# Patient Record
Sex: Female | Born: 1965 | Race: Black or African American | Hispanic: No | Marital: Married | Smoking: Never smoker
Health system: Southern US, Community
[De-identification: ages and names within clinical notes are randomized; demographics above are authoritative.]

## PROBLEM LIST (undated history)

## (undated) DIAGNOSIS — Z789 Other specified health status: Secondary | ICD-10-CM

## (undated) DIAGNOSIS — D259 Leiomyoma of uterus, unspecified: Secondary | ICD-10-CM

## (undated) DIAGNOSIS — D649 Anemia, unspecified: Secondary | ICD-10-CM

## (undated) HISTORY — PX: DIAGNOSTIC LAPAROSCOPY: SUR761

## (undated) HISTORY — PX: DILATION AND CURETTAGE OF UTERUS: SHX78

---

## 1999-07-28 ENCOUNTER — Other Ambulatory Visit: Admission: RE | Admit: 1999-07-28 | Discharge: 1999-07-28 | Payer: Self-pay | Admitting: Gynecology

## 2001-02-16 ENCOUNTER — Other Ambulatory Visit: Admission: RE | Admit: 2001-02-16 | Discharge: 2001-02-16 | Payer: Self-pay | Admitting: Obstetrics and Gynecology

## 2002-07-31 ENCOUNTER — Encounter: Payer: Self-pay | Admitting: Obstetrics and Gynecology

## 2002-07-31 ENCOUNTER — Ambulatory Visit (HOSPITAL_COMMUNITY): Admission: RE | Admit: 2002-07-31 | Discharge: 2002-07-31 | Payer: Self-pay | Admitting: Obstetrics and Gynecology

## 2003-02-17 ENCOUNTER — Encounter: Payer: Self-pay | Admitting: Obstetrics and Gynecology

## 2003-02-17 ENCOUNTER — Inpatient Hospital Stay (HOSPITAL_COMMUNITY): Admission: AD | Admit: 2003-02-17 | Discharge: 2003-02-17 | Payer: Self-pay | Admitting: Obstetrics and Gynecology

## 2003-02-19 ENCOUNTER — Inpatient Hospital Stay (HOSPITAL_COMMUNITY): Admission: AD | Admit: 2003-02-19 | Discharge: 2003-02-19 | Payer: Self-pay | Admitting: Obstetrics and Gynecology

## 2005-10-06 ENCOUNTER — Other Ambulatory Visit: Admission: RE | Admit: 2005-10-06 | Discharge: 2005-10-06 | Payer: Self-pay | Admitting: Obstetrics and Gynecology

## 2006-12-19 ENCOUNTER — Encounter: Admission: RE | Admit: 2006-12-19 | Discharge: 2006-12-19 | Payer: Self-pay | Admitting: Obstetrics and Gynecology

## 2010-12-30 ENCOUNTER — Other Ambulatory Visit: Payer: Self-pay | Admitting: Family Medicine

## 2010-12-30 DIAGNOSIS — N6320 Unspecified lump in the left breast, unspecified quadrant: Secondary | ICD-10-CM

## 2010-12-31 ENCOUNTER — Other Ambulatory Visit: Payer: Self-pay | Admitting: Family Medicine

## 2010-12-31 ENCOUNTER — Ambulatory Visit
Admission: RE | Admit: 2010-12-31 | Discharge: 2010-12-31 | Disposition: A | Discharge status: Home / Self Care | Source: Ambulatory Visit | Attending: Family Medicine | Admitting: Family Medicine

## 2010-12-31 DIAGNOSIS — N6320 Unspecified lump in the left breast, unspecified quadrant: Secondary | ICD-10-CM

## 2011-07-12 ENCOUNTER — Other Ambulatory Visit: Payer: Self-pay | Admitting: Obstetrics and Gynecology

## 2012-09-29 ENCOUNTER — Encounter (HOSPITAL_COMMUNITY): Payer: Self-pay | Admitting: Pharmacist

## 2012-10-04 ENCOUNTER — Other Ambulatory Visit: Payer: Self-pay | Admitting: Obstetrics and Gynecology

## 2012-10-04 DIAGNOSIS — R1032 Left lower quadrant pain: Secondary | ICD-10-CM

## 2012-10-05 ENCOUNTER — Other Ambulatory Visit

## 2012-10-08 ENCOUNTER — Ambulatory Visit
Admission: RE | Admit: 2012-10-08 | Discharge: 2012-10-08 | Disposition: A | Discharge status: Home / Self Care | Source: Ambulatory Visit | Attending: Obstetrics and Gynecology | Admitting: Obstetrics and Gynecology

## 2012-10-08 DIAGNOSIS — R1032 Left lower quadrant pain: Secondary | ICD-10-CM

## 2012-10-08 MED ORDER — IOHEXOL 300 MG/ML  SOLN
100.0000 mL | Freq: Once | INTRAMUSCULAR | Status: AC | PRN
  Administered 2012-10-08: 100 mL via INTRAVENOUS

## 2012-10-09 ENCOUNTER — Encounter (HOSPITAL_COMMUNITY): Admission: RE | Payer: Self-pay | Source: Ambulatory Visit

## 2012-10-09 ENCOUNTER — Ambulatory Visit (HOSPITAL_COMMUNITY): Admission: RE | Admit: 2012-10-09 | Source: Ambulatory Visit | Admitting: Obstetrics and Gynecology

## 2012-10-09 SURGERY — HYSTERECTOMY, SUPRACERVICAL, ABDOMINAL
Anesthesia: General

## 2012-12-03 ENCOUNTER — Encounter (HOSPITAL_COMMUNITY): Payer: Self-pay | Admitting: Pharmacist

## 2012-12-05 ENCOUNTER — Other Ambulatory Visit: Payer: Self-pay | Admitting: Obstetrics and Gynecology

## 2012-12-12 ENCOUNTER — Encounter (HOSPITAL_COMMUNITY)
Admission: RE | Admit: 2012-12-12 | Discharge: 2012-12-12 | Disposition: A | Discharge status: Home / Self Care | Source: Ambulatory Visit | Attending: Obstetrics and Gynecology | Admitting: Obstetrics and Gynecology

## 2012-12-12 ENCOUNTER — Encounter (HOSPITAL_COMMUNITY): Payer: Self-pay

## 2012-12-12 HISTORY — DX: Other specified health status: Z78.9

## 2012-12-12 LAB — COMPREHENSIVE METABOLIC PANEL
ALT: 15 U/L (ref 0–35)
AST: 22 U/L (ref 0–37)
Alkaline Phosphatase: 37 U/L — ABNORMAL LOW (ref 39–117)
CO2: 26 mEq/L (ref 19–32)
Chloride: 101 mEq/L (ref 96–112)
GFR calc non Af Amer: 79 mL/min — ABNORMAL LOW (ref >90)
Glucose, Bld: 113 mg/dL — ABNORMAL HIGH (ref 70–99)
Sodium: 137 mEq/L (ref 135–145)
Total Bilirubin: 0.3 mg/dL (ref 0.3–1.2)

## 2012-12-12 LAB — CBC
Hemoglobin: 11.3 g/dL — ABNORMAL LOW (ref 12.0–15.0)
MCV: 92.5 fL (ref 78.0–100.0)
Platelets: 244 10*3/uL (ref 150–400)
RBC: 3.72 MIL/uL — ABNORMAL LOW (ref 3.87–5.11)
WBC: 5.1 10*3/uL (ref 4.0–10.5)

## 2012-12-12 NOTE — Patient Instructions (Addendum)
20 LIL LEPAGE  12/12/2012   Your procedure is scheduled on:  12/18/12  Enter through the Main Entrance of [facility] at 730 AM.  Pick up the phone at the desk and dial [phone].   Call this number if you have problems the morning of surgery: [phone]   Remember:   Do not eat food:After Midnight.  Do not drink clear liquids: After Midnight.  Take these medicines the morning of surgery with A SIP OF WATER: NA   Do not wear jewelry, make-up or nail polish.  Do not wear lotions, powders, or perfumes. You may wear deodorant.  Do not shave 48 hours prior to surgery.  Do not bring valuables to the hospital.  Contacts, dentures or bridgework may not be worn into surgery.  Leave suitcase in the car. After surgery it may be brought to your room.  For patients admitted to the hospital, checkout time is 11:00 AM the day of discharge.   Patients discharged the day of surgery will not be allowed to drive home.  Name and phone number of your driver: NA  Special Instructions: Shower using CHG 2 nights before surgery and the night before surgery.  If you shower the day of surgery use CHG.  Use special wash - you have one bottle of CHG for all showers.  You should use approximately 1/3 of the bottle for each shower.   Please read over the following fact sheets that you were given: MRSA Information and Surgical Site Infection Prevention

## 2012-12-17 NOTE — H&P (Signed)
Rebecca Wiley is an 47 y.o. female with a history of very heavy periods and large fibroids. Attempts were made to control her heavy cycles using a Mirena IUD but this proved unsuccessful. Previously she was treated in 2012 with depot Lupron but this was stopped after 3 courses. She is now being admitted to undergo a supracervical hysterectomy to deal with these large fibroids. The patient has requested that her cervix be preserved if possible at the time of surgery. This menorrhagia from these fibroids has led to significant anemia which has been treated with hematinic agents.  Pertinent Gynecological History: Menses: flow is excessive with use of 8 pads or tampons on heaviest days Contraception: IUD DES exposure: denies Blood transfusions: none Sexually transmitted diseases: no past history Last mammogram: normal Date: 08/20/2012 Last pap: normal Date: 07/12/2011 OB History: G2 P0020  Menstrual History: Menarche age:53 No LMP recorded. Patient is not currently having periods (Reason: IUD).    Past Medical History  Diagnosis Date  . Medical history non-contributory     Past Surgical History  Procedure Laterality Date  . Diagnostic laparoscopy    . Dilation and curettage of uterus      No family history on file.  Social History:  reports that she has never smoked. She does not have any smokeless tobacco history on file. She reports that  drinks alcohol. She reports that she does not use illicit drugs.  Allergies:  Allergies  Allergen Reactions  . Latex Other (See Comments)    Bacterial infection  . Sulfa Antibiotics Swelling and Rash    Eye swelling    No prescriptions prior to admission    Review of Systems  Constitutional: Negative.  Negative for fever, chills and weight loss.  HENT: Negative.  Negative for hearing loss and tinnitus.   Eyes: Negative.   Respiratory: Negative for cough, hemoptysis, sputum production and shortness of breath.   Cardiovascular: Negative  for chest pain, palpitations and orthopnea.  Gastrointestinal: Negative for heartburn, nausea, vomiting, abdominal pain, diarrhea and constipation.  Genitourinary: Negative for dysuria, urgency and frequency.  Musculoskeletal: Negative.   Skin: Negative.   Neurological: Negative for dizziness, tingling, tremors and headaches.  Endo/Heme/Allergies: Negative.   Psychiatric/Behavioral: Negative.     There were no vitals taken for this visit. Physical Exam  Constitutional: She is oriented to person, place, and time. She appears well-developed and well-nourished.  HENT:  Head: Normocephalic and atraumatic.  Mouth/Throat: Oropharynx is clear and moist.  Eyes: Conjunctivae and EOM are normal. Pupils are equal, round, and reactive to light.  Neck: Normal range of motion. Neck supple. JVD present. No tracheal deviation present. No thyromegaly present.  Cardiovascular: Normal rate, regular rhythm and normal heart sounds.  Exam reveals no gallop.   No murmur heard. Respiratory: Effort normal and breath sounds normal.  GI: Soft. She exhibits mass. She exhibits no distension. There is no tenderness. There is no rebound.  Musculoskeletal: Normal range of motion.  Neurological: She is alert and oriented to person, place, and time. She has normal reflexes.  Skin: Skin is warm and dry.  Psychiatric: She has a normal mood and affect. Her behavior is normal. Judgment and thought content normal.  There is a suprapubic mass present almost to the umbilicus consistent with a fibroid uterus. Pelvic Exam:   External genitalia are within normal liits   BUS: are normal   Vagina is without lesion and has normal discharge   Cervix is non tender and without gross lesions  Uterus is markedly enlarged 16 week equivalent size with fibroids present   Adnexa can not be assessed do to the fibroids   No results found for this or any previous visit (from the past 24 hour(s)).  No results found.  Impression:  Menorrhagia secondary to large uterine fibroids.  Plan: To proceed with supracervical hysterectomy. The risks of infection, hemorrhage, blood clot formation and injury to adjacent structures were discussed with the patient and her partner.  Shiann Kam 12/17/2012, 7:07 PM

## 2012-12-18 ENCOUNTER — Inpatient Hospital Stay (HOSPITAL_COMMUNITY): Admitting: Anesthesiology

## 2012-12-18 ENCOUNTER — Encounter (HOSPITAL_COMMUNITY): Payer: Self-pay | Admitting: General Practice

## 2012-12-18 ENCOUNTER — Inpatient Hospital Stay (HOSPITAL_COMMUNITY)
Admission: RE | Admit: 2012-12-18 | Discharge: 2012-12-20 | DRG: 743 | Disposition: A | Discharge status: Home / Self Care | Source: Ambulatory Visit | Attending: Obstetrics and Gynecology | Admitting: Obstetrics and Gynecology

## 2012-12-18 ENCOUNTER — Encounter (HOSPITAL_COMMUNITY)
Admission: RE | Disposition: A | Discharge status: Home / Self Care | Payer: Self-pay | Source: Ambulatory Visit | Attending: Obstetrics and Gynecology

## 2012-12-18 ENCOUNTER — Encounter (HOSPITAL_COMMUNITY): Payer: Self-pay | Admitting: Anesthesiology

## 2012-12-18 DIAGNOSIS — D219 Benign neoplasm of connective and other soft tissue, unspecified: Secondary | ICD-10-CM

## 2012-12-18 DIAGNOSIS — D251 Intramural leiomyoma of uterus: Secondary | ICD-10-CM | POA: Diagnosis present

## 2012-12-18 DIAGNOSIS — N920 Excessive and frequent menstruation with regular cycle: Principal | ICD-10-CM | POA: Diagnosis present

## 2012-12-18 HISTORY — PX: ABDOMINAL HYSTERECTOMY: SHX81

## 2012-12-18 LAB — HEMOGLOBIN: Hemoglobin: 9.5 g/dL — ABNORMAL LOW (ref 12.0–15.0)

## 2012-12-18 SURGERY — HYSTERECTOMY, ABDOMINAL
Anesthesia: General | Site: Abdomen | Wound class: Clean Contaminated

## 2012-12-18 MED ORDER — KETOROLAC TROMETHAMINE 30 MG/ML IJ SOLN: 15.0000 mg | Freq: Once | INTRAMUSCULAR | Status: DC | PRN

## 2012-12-18 MED ORDER — OXYCODONE-ACETAMINOPHEN 5-325 MG PO TABS
1.0000 | ORAL_TABLET | ORAL | Status: DC | PRN
  Administered 2012-12-19: 1 via ORAL
  Administered 2012-12-19 (×2): 2 via ORAL
  Administered 2012-12-20: 1 via ORAL
  Filled 2012-12-18 (×2): qty 2
  Filled 2012-12-18 (×2): qty 1

## 2012-12-18 MED ORDER — DEXAMETHASONE SODIUM PHOSPHATE 10 MG/ML IJ SOLN
INTRAMUSCULAR | Status: DC | PRN
  Administered 2012-12-18: 10 mg via INTRAVENOUS

## 2012-12-18 MED ORDER — DIPHENHYDRAMINE HCL 12.5 MG/5ML PO ELIX: 12.5000 mg | ORAL_SOLUTION | Freq: Four times a day (QID) | ORAL | Status: DC | PRN

## 2012-12-18 MED ORDER — METOCLOPRAMIDE HCL 5 MG/ML IJ SOLN: 10.0000 mg | Freq: Once | INTRAMUSCULAR | Status: DC | PRN

## 2012-12-18 MED ORDER — MIDAZOLAM HCL 2 MG/2ML IJ SOLN
INTRAMUSCULAR | Status: AC
  Filled 2012-12-18: qty 2

## 2012-12-18 MED ORDER — SODIUM CHLORIDE 0.9 % IJ SOLN: 9.0000 mL | INTRAMUSCULAR | Status: DC | PRN

## 2012-12-18 MED ORDER — SENSORCAINE 0.25 % 270ML FOR PAIN PUMP OPTIME
INTRAMUSCULAR | Status: DC | PRN
  Administered 2012-12-18: 270 mL

## 2012-12-18 MED ORDER — FENTANYL CITRATE 0.05 MG/ML IJ SOLN
INTRAMUSCULAR | Status: AC
  Filled 2012-12-18: qty 5

## 2012-12-18 MED ORDER — DEXAMETHASONE SODIUM PHOSPHATE 10 MG/ML IJ SOLN
INTRAMUSCULAR | Status: AC
  Filled 2012-12-18: qty 1

## 2012-12-18 MED ORDER — LIDOCAINE HCL (CARDIAC) 20 MG/ML IV SOLN
INTRAVENOUS | Status: AC
  Filled 2012-12-18: qty 5

## 2012-12-18 MED ORDER — DIPHENHYDRAMINE HCL 50 MG/ML IJ SOLN
INTRAMUSCULAR | Status: AC
  Filled 2012-12-18: qty 1

## 2012-12-18 MED ORDER — CEFAZOLIN SODIUM-DEXTROSE 2-3 GM-% IV SOLR
INTRAVENOUS | Status: AC
  Filled 2012-12-18: qty 50

## 2012-12-18 MED ORDER — HYDROMORPHONE 0.3 MG/ML IV SOLN
INTRAVENOUS | Status: DC
  Administered 2012-12-18: 12:00:00 via INTRAVENOUS
  Administered 2012-12-18 (×2): 1.2 mg via INTRAVENOUS
  Administered 2012-12-18 – 2012-12-19 (×3): 0.9 mg via INTRAVENOUS
  Administered 2012-12-19: 0.6 mg via INTRAVENOUS
  Filled 2012-12-18: qty 25

## 2012-12-18 MED ORDER — ROCURONIUM BROMIDE 50 MG/5ML IV SOLN
INTRAVENOUS | Status: AC
  Filled 2012-12-18: qty 1

## 2012-12-18 MED ORDER — PROPOFOL 10 MG/ML IV EMUL
INTRAVENOUS | Status: DC | PRN
  Administered 2012-12-18: 180 mg via INTRAVENOUS

## 2012-12-18 MED ORDER — LIDOCAINE HCL 1 % IJ SOLN
INTRAMUSCULAR | Status: DC | PRN
  Administered 2012-12-18: 2 mL

## 2012-12-18 MED ORDER — ONDANSETRON HCL 4 MG/2ML IJ SOLN
INTRAMUSCULAR | Status: AC
  Filled 2012-12-18: qty 2

## 2012-12-18 MED ORDER — ONDANSETRON HCL 4 MG/2ML IJ SOLN: 4.0000 mg | Freq: Four times a day (QID) | INTRAMUSCULAR | Status: DC | PRN

## 2012-12-18 MED ORDER — HYDROMORPHONE HCL PF 1 MG/ML IJ SOLN
INTRAMUSCULAR | Status: AC
  Administered 2012-12-18: 0.5 mg via INTRAVENOUS
  Filled 2012-12-18: qty 1

## 2012-12-18 MED ORDER — DIPHENHYDRAMINE HCL 50 MG/ML IJ SOLN
INTRAMUSCULAR | Status: DC | PRN
  Administered 2012-12-18: 25 mg via INTRAVENOUS

## 2012-12-18 MED ORDER — LIDOCAINE HCL (CARDIAC) 20 MG/ML IV SOLN
INTRAVENOUS | Status: DC | PRN
  Administered 2012-12-18: 50 mg via INTRAVENOUS

## 2012-12-18 MED ORDER — MIDAZOLAM HCL 5 MG/5ML IJ SOLN
INTRAMUSCULAR | Status: DC | PRN
  Administered 2012-12-18: 2 mg via INTRAVENOUS

## 2012-12-18 MED ORDER — PROPOFOL 10 MG/ML IV EMUL
INTRAVENOUS | Status: AC
  Filled 2012-12-18: qty 20

## 2012-12-18 MED ORDER — SCOPOLAMINE 1 MG/3DAYS TD PT72
MEDICATED_PATCH | TRANSDERMAL | Status: AC
  Administered 2012-12-18: 1.5 mg
  Filled 2012-12-18: qty 1

## 2012-12-18 MED ORDER — BUPIVACAINE HCL (PF) 0.5 % IJ SOLN
INTRAMUSCULAR | Status: AC
  Filled 2012-12-18: qty 270

## 2012-12-18 MED ORDER — 0.9 % SODIUM CHLORIDE (POUR BTL) OPTIME
TOPICAL | Status: DC | PRN
  Administered 2012-12-18 (×2): 1000 mL

## 2012-12-18 MED ORDER — HYDROMORPHONE HCL PF 1 MG/ML IJ SOLN
INTRAMUSCULAR | Status: AC
  Filled 2012-12-18: qty 1

## 2012-12-18 MED ORDER — ARTIFICIAL TEARS OP OINT
TOPICAL_OINTMENT | OPHTHALMIC | Status: AC
  Filled 2012-12-18: qty 3.5

## 2012-12-18 MED ORDER — CEFAZOLIN SODIUM 1-5 GM-% IV SOLN
1.0000 g | Freq: Three times a day (TID) | INTRAVENOUS | Status: AC
  Administered 2012-12-18 – 2012-12-19 (×3): 1 g via INTRAVENOUS
  Filled 2012-12-18 (×3): qty 50

## 2012-12-18 MED ORDER — MENTHOL 3 MG MT LOZG: 1.0000 | LOZENGE | OROMUCOSAL | Status: DC | PRN

## 2012-12-18 MED ORDER — HYDROMORPHONE HCL PF 1 MG/ML IJ SOLN
0.2500 mg | INTRAMUSCULAR | Status: DC | PRN
  Administered 2012-12-18 (×3): 0.5 mg via INTRAVENOUS

## 2012-12-18 MED ORDER — LACTATED RINGERS IV SOLN
INTRAVENOUS | Status: DC
  Administered 2012-12-18: 08:00:00 via INTRAVENOUS

## 2012-12-18 MED ORDER — FENTANYL CITRATE 0.05 MG/ML IJ SOLN
INTRAMUSCULAR | Status: DC | PRN
  Administered 2012-12-18 (×3): 100 ug via INTRAVENOUS

## 2012-12-18 MED ORDER — IBUPROFEN 600 MG PO TABS
600.0000 mg | ORAL_TABLET | Freq: Four times a day (QID) | ORAL | Status: DC | PRN
  Administered 2012-12-19 – 2012-12-20 (×2): 600 mg via ORAL
  Filled 2012-12-18 (×2): qty 1

## 2012-12-18 MED ORDER — DIPHENHYDRAMINE HCL 50 MG/ML IJ SOLN: 12.5000 mg | Freq: Four times a day (QID) | INTRAMUSCULAR | Status: DC | PRN

## 2012-12-18 MED ORDER — NALOXONE HCL 0.4 MG/ML IJ SOLN: 0.4000 mg | INTRAMUSCULAR | Status: DC | PRN

## 2012-12-18 MED ORDER — NEOSTIGMINE METHYLSULFATE 1 MG/ML IJ SOLN
INTRAMUSCULAR | Status: DC | PRN
  Administered 2012-12-18: 1 mg via INTRAVENOUS

## 2012-12-18 MED ORDER — LACTATED RINGERS IV SOLN
INTRAVENOUS | Status: DC
  Administered 2012-12-18 – 2012-12-19 (×2): via INTRAVENOUS

## 2012-12-18 MED ORDER — ONDANSETRON HCL 4 MG/2ML IJ SOLN
INTRAMUSCULAR | Status: DC | PRN
  Administered 2012-12-18: 4 mg via INTRAVENOUS

## 2012-12-18 MED ORDER — CEFAZOLIN SODIUM 1-5 GM-% IV SOLN
1.0000 g | Freq: Three times a day (TID) | INTRAVENOUS | Status: DC
  Filled 2012-12-18 (×2): qty 50

## 2012-12-18 MED ORDER — ROCURONIUM BROMIDE 100 MG/10ML IV SOLN
INTRAVENOUS | Status: DC | PRN
  Administered 2012-12-18: 40 mg via INTRAVENOUS
  Administered 2012-12-18: 10 mg via INTRAVENOUS

## 2012-12-18 MED ORDER — CEFAZOLIN SODIUM-DEXTROSE 2-3 GM-% IV SOLR
2.0000 g | INTRAVENOUS | Status: AC
  Administered 2012-12-18: 2 g via INTRAVENOUS

## 2012-12-18 MED ORDER — GLYCOPYRROLATE 0.2 MG/ML IJ SOLN
INTRAMUSCULAR | Status: DC | PRN
  Administered 2012-12-18: 0.2 mg via INTRAVENOUS

## 2012-12-18 MED ORDER — BUPIVACAINE HCL (PF) 0.25 % IJ SOLN
INTRAMUSCULAR | Status: AC
  Filled 2012-12-18: qty 270

## 2012-12-18 SURGICAL SUPPLY — 37 items
APL SKNCLS STERI-STRIP NONHPOA (GAUZE/BANDAGES/DRESSINGS) ×1
BENZOIN TINCTURE PRP APPL 2/3 (GAUZE/BANDAGES/DRESSINGS) ×1 IMPLANT
CANISTER SUCTION 2500CC (MISCELLANEOUS) ×2 IMPLANT
CLOTH BEACON ORANGE TIMEOUT ST (SAFETY) ×2 IMPLANT
CONT PATH 16OZ SNAP LID 3702 (MISCELLANEOUS) ×2 IMPLANT
DECANTER SPIKE VIAL GLASS SM (MISCELLANEOUS) IMPLANT
DRESSING TELFA 8X3 (GAUZE/BANDAGES/DRESSINGS) ×1 IMPLANT
DRSG OPSITE 6X11 MED (GAUZE/BANDAGES/DRESSINGS) ×1 IMPLANT
DRSG OPSITE POSTOP 4X10 (GAUZE/BANDAGES/DRESSINGS) ×1 IMPLANT
GLOVE BIO SURGEON STRL SZ7.5 (GLOVE) ×2 IMPLANT
GLOVE BIOGEL PI IND STRL 7.5 (GLOVE) IMPLANT
GLOVE BIOGEL PI INDICATOR 7.5 (GLOVE) ×6
GLOVE INDICATOR 7.5 STRL GRN (GLOVE) ×2 IMPLANT
GOWN PREVENTION PLUS LG XLONG (DISPOSABLE) ×4 IMPLANT
GOWN PREVENTION PLUS XXLARGE (GOWN DISPOSABLE) ×2 IMPLANT
NDL HYPO 25X1 1.5 SAFETY (NEEDLE) IMPLANT
NEEDLE HYPO 25X1 1.5 SAFETY (NEEDLE) IMPLANT
NS IRRIG 1000ML POUR BTL (IV SOLUTION) ×2 IMPLANT
PACK ABDOMINAL GYN (CUSTOM PROCEDURE TRAY) ×2 IMPLANT
PAD OB MATERNITY 4.3X12.25 (PERSONAL CARE ITEMS) ×2 IMPLANT
PAIN PUMP ON-Q 270MLX4ML 5IN (PAIN MANAGEMENT) ×1 IMPLANT
PROTECTOR NERVE ULNAR (MISCELLANEOUS) ×2 IMPLANT
SPONGE LAP 18X18 X RAY DECT (DISPOSABLE) ×4 IMPLANT
STAPLER VISISTAT 35W (STAPLE) ×2 IMPLANT
STRIP CLOSURE SKIN 1/2X4 (GAUZE/BANDAGES/DRESSINGS) ×1 IMPLANT
SUT VIC AB 0 CT1 18XCR BRD8 (SUTURE) ×3 IMPLANT
SUT VIC AB 0 CT1 27 (SUTURE) ×8
SUT VIC AB 0 CT1 27XBRD ANBCTR (SUTURE) ×4 IMPLANT
SUT VIC AB 0 CT1 8-18 (SUTURE) ×6
SUT VIC AB 2-0 SH 27 (SUTURE)
SUT VIC AB 2-0 SH 27XBRD (SUTURE) IMPLANT
SUT VICRYL 0 TIES 12 18 (SUTURE) ×2 IMPLANT
SUT VICRYL 4-0 PS2 18IN ABS (SUTURE) ×1 IMPLANT
SYR CONTROL 10ML LL (SYRINGE) IMPLANT
TOWEL OR 17X24 6PK STRL BLUE (TOWEL DISPOSABLE) ×4 IMPLANT
TRAY FOLEY CATH 14FR (SET/KITS/TRAYS/PACK) ×2 IMPLANT
WATER STERILE IRR 1000ML POUR (IV SOLUTION) ×2 IMPLANT

## 2012-12-18 NOTE — H&P (Signed)
  Status unchanged. Will proceed with planned supra cervical hysterectomy.

## 2012-12-18 NOTE — Anesthesia Preprocedure Evaluation (Signed)
Anesthesia Evaluation  Patient identified by MRN, date of birth, ID band Patient awake    Reviewed: Allergy & Precautions, H&P , NPO status , Patient's Chart, lab work & pertinent test results, reviewed documented beta blocker date and time   History of Anesthesia Complications (+) PONV  Airway Mallampati: III TM Distance: >3 FB Neck ROM: full    Dental  (+) Teeth Intact   Pulmonary neg pulmonary ROS,  breath sounds clear to auscultation  Pulmonary exam normal       Cardiovascular Exercise Tolerance: Good negative cardio ROS  Rhythm:regular Rate:Normal     Neuro/Psych PSYCHIATRIC DISORDERS (xanax for anxiety - once a week) negative neurological ROS     GI/Hepatic negative GI ROS, Neg liver ROS,   Endo/Other  negative endocrine ROS  Renal/GU negative Renal ROS  Female GU complaint     Musculoskeletal   Abdominal   Peds  Hematology  (+) anemia ,   Anesthesia Other Findings   Reproductive/Obstetrics negative OB ROS                           Anesthesia Physical Anesthesia Plan  ASA: II  Anesthesia Plan: General ETT   Post-op Pain Management:    Induction:   Airway Management Planned:   Additional Equipment:   Intra-op Plan:   Post-operative Plan:   Informed Consent: I have reviewed the patients History and Physical, chart, labs and discussed the procedure including the risks, benefits and alternatives for the proposed anesthesia with the patient or authorized representative who has indicated his/her understanding and acceptance.   Dental Advisory Given  Plan Discussed with: CRNA and Surgeon  Anesthesia Plan Comments:         Anesthesia Quick Evaluation

## 2012-12-18 NOTE — Op Note (Signed)
NAMEJESSLY, LEBECK NO.:  [id number]  MEDICAL RECORD NO.:  [id number]  LOCATION:  9317                          FACILITY:  [facility]  PHYSICIAN:  Miguel Aschoff, M.D.       DATE OF BIRTH:  Jan 16, 1966  DATE OF PROCEDURE:  12/18/2012 DATE OF DISCHARGE:                              OPERATIVE REPORT   PREOPERATIVE DIAGNOSIS:  Menorrhagia secondary to uterine fibroids.  POSTOPERATIVE DIAGNOSIS:  Menorrhagia secondary to uterine fibroids.  PROCEDURE:  Supracervical hysterectomy.  SURGEON:  Miguel Aschoff, M.D.  ASSISTANT:  Dr. Lodema Hong.  ANESTHESIA:  General.  COMPLICATIONS:  None.  JUSTIFICATION:  The patient is a 47 year old black female with history of very heavy menses secondary to large uterine fibroids.  Attempts were made to control her heavy blood flow using Lupron Depot therapy, and also a Mirena IUD.  However, the heavy bleeding continued with significant anemia developing.  The anemia was corrected, but in view of the persistent heavy bleeding she now presents to undergo definitive therapy via hysterectomy.  The patient has requested a procedure that her cervix be conserved and she has given informed consent for supracervical hysterectomy.  The risks and benefits of procedure were discussed with the patient and her husband and informed consent has been obtained.  PROCEDURE IN DETAIL:  The patient was taken to the operating room, placed in supine position.  General anesthesia was administered without difficulty.  Foley catheter was inserted.  Then, she was prepped and draped in usual sterile fashion.  Pfannenstiel incision was then made extending down through the subcutaneous tissue with bleeding points being clamped and coagulated as they were encountered.  The fascia was then identified and incised transversely and separated from the underlying rectus muscles.  Rectus muscles were divided in the midline. The peritoneum was then found and entered  carefully to the underlying structures.  The self-retaining retractor was placed through the wound. The viscera packed out of the pelvis and inspection revealed a large globular uterus approximately 14-16 weeks equivalent size with a large fundal fibroid.  There were no cervical fibroids noted.  There were adhesions holding the left ovary to the posterior surface of the uterus. The right ovary was within normal limits.  The adhesions were lysed. The anatomy restored to normal configuration.  At this point, the round ligaments were suture ligated.  An anterior bladder flap was created without difficulty.  Perforations were then made below the utero-ovarian ligaments.  These ligaments were clamped with Heaney clamps.  These pedicles cut and doubly ligated using suture ligatures of 0 Vicryl and free ties of 0 Vicryl.  Additional bites were then taken of the parametrial tissue with curved Heaney clamps.  All pedicles were suture ligated using suture ligatures of 0 Vicryl.  This continued down to the level of the uterine arteries.  At this point, the uterine vessels were clamped with curved Heaney clamps, and the fundus was excised without difficulty to allow better visualization into the pelvis.  Additional bites were then taken of paracervical fashion using straight Heaney clamps, and then a cone was cut cutting down into the endocervical canal to try to alleviate and remove as much  endocervical tissue as possible, and then the cervical stump was closed using several interrupted 0 Vicryl sutures.  Once the cervical stump was closed, attention was directed to all other pedicles.  Hemostasis appeared to be excellent. The pelvis was irrigated with warm saline.  Lap counts and instrument counts were taken, and found to be correct and then we elected to close the abdomen.  The parietal peritoneum was closed using running continuous 0 Vicryl suture.  The rectus muscles were reapproximated using  running continuous 0 Vicryl suture.  A ON-Q pain release system was then placed, placing one catheter below the fascia and one catheter above the fascia.  The fascia was then closed using running interlocking 0 Vicryl suture.  The subcutaneous tissue was closed using 4 interrupted 0 Vicryl sutures, and then the skin incision was closed using subcuticular 3-0 Vicryl.  Steri-Strips were applied as was a pressure dressing.  The patient reversed from the anesthetic and taken to recovery room satisfactory condition.  The estimated blood loss from the procedure was approximately 300 mL.  The patient tolerated the procedure well.     Miguel Aschoff, M.D.     AR/MEDQ  D:  12/18/2012  T:  12/18/2012  Job:  [id number]

## 2012-12-18 NOTE — Anesthesia Postprocedure Evaluation (Signed)
  Anesthesia Post Note  Patient: Rebecca Wiley  Procedure(s) Performed: Procedure(s) (LRB): HYSTERECTOMY ABDOMINAL (N/A)  Anesthesia type: GA  Patient location: PACU  Post pain: Pain level controlled  Post assessment: Post-op Vital signs reviewed  Last Vitals:  Filed Vitals:   12/18/12 1130  BP: 118/66  Pulse: 59  Temp: 36.3 C  Resp: 16    Post vital signs: Reviewed  Level of consciousness: sedated  Complications: No apparent anesthesia complications

## 2012-12-18 NOTE — Transfer of Care (Signed)
Immediate Anesthesia Transfer of Care Note  Patient: Rebecca Wiley  Procedure(s) Performed: Procedure(s) with comments: HYSTERECTOMY ABDOMINAL (N/A) - with On Q pump  Patient Location: PACU  Anesthesia Type:General  Level of Consciousness: sedated  Airway & Oxygen Therapy: Patient Spontanous Breathing and Patient connected to nasal cannula oxygen  Post-op Assessment: Report given to PACU RN  Post vital signs: Reviewed and stable  Complications: No apparent anesthesia complications

## 2012-12-18 NOTE — Brief Op Note (Signed)
12/18/2012  10:30 AM  PATIENT:  Rebecca Wiley  47 y.o. female  PRE-OPERATIVE DIAGNOSIS: Menorrhagia and uterine firoids   POST-OPERATIVE DIAGNOSIS:  Same  PROCEDURE:  Procedure(s): HYSTERECTOMY ABDOMINAL (N/A) Supra cervical  SURGEON:  Surgeon(s) and Role:    * Miguel Aschoff, MD - Primary    * W Lodema Hong, MD - Assisting   ANESTHESIA:   general  EBL:  Total I/O In: 1900 [I.V.:1900] Out: 400 [Urine:100; Blood:300]  BLOOD ADMINISTERED:none  DRAINS: Urinary Catheter (Foley) OnQ pain relief sytem  LOCAL MEDICATIONS USED:  LIDOCAINE   SPECIMEN:  Source of Specimen:  Uterus  DISPOSITION OF SPECIMEN:  PATHOLOGY  COUNTS:  YES  TOURNIQUET:  * No tourniquets in log *  DICTATION: .Other Dictation: Dictation Number [id number]  PLAN OF CARE: Admit to inpatient   PATIENT DISPOSITION:  PACU - hemodynamically stable.   Delay start of Pharmacological VTE agent (>24hrs) due to surgical blood loss or risk of bleeding: PAS hose applied

## 2012-12-19 ENCOUNTER — Encounter (HOSPITAL_COMMUNITY): Payer: Self-pay | Admitting: Obstetrics and Gynecology

## 2012-12-19 LAB — CBC
MCHC: 32.6 g/dL (ref 30.0–36.0)
Platelets: 198 10*3/uL (ref 150–400)
RDW: 13.4 % (ref 11.5–15.5)
WBC: 6.1 10*3/uL (ref 4.0–10.5)

## 2012-12-19 MED ORDER — INFLUENZA VIRUS VACC SPLIT PF IM SUSP
0.5000 mL | INTRAMUSCULAR | Status: AC
  Administered 2012-12-20: 0.5 mL via INTRAMUSCULAR
  Filled 2012-12-19: qty 0.5

## 2012-12-19 NOTE — Progress Notes (Signed)
Patient ID: Rebecca Wiley, female   DOB: [birth year], 47 y.o.   MRN: [id number]  S: Stable this AM and throughout the night  O: Afebrile  BP 104/66  Pulse 66  Abdomen is soft wound is clean, dry with no signs of hematoma or erythema  Hg 8.5  Pre op was 11.5 estimated blood loss 300 to 500 cc during surgery.  A: Stable with post op anemia without symptoms.  P: Ambulate      Increase diet     Repeat CBC in AM 12/20/2012     D/C foley      Will leave PCA in place      Reassess this PM

## 2012-12-20 LAB — CBC
Hemoglobin: 8 g/dL — ABNORMAL LOW (ref 12.0–15.0)
MCH: 29.7 pg (ref 26.0–34.0)
MCV: 92.6 fL (ref 78.0–100.0)
Platelets: 176 10*3/uL (ref 150–400)
RBC: 2.69 MIL/uL — ABNORMAL LOW (ref 3.87–5.11)

## 2012-12-20 MED ORDER — INFLUENZA VIRUS VACC SPLIT PF IM SUSP: 0.5000 mL | INTRAMUSCULAR | Status: DC

## 2012-12-20 NOTE — Progress Notes (Signed)
Patient ID: Rebecca Wiley, female   DOB: 1965-12-23, 47 y.o.   MRN: [id number]  Post op day #2  S: Feeling well this Am. On a regular diet and ambulating well  O: Afebrile BP 95/54  Abdomen is soft and wound is healing well. OnQ device removed intact.  Pathology benign fibroid.  A: Excellent post op course with post op anemia.  Plan: Discharge home            Meds Tylox one every 3 to 4 hours as needed for pain and iron            Return to office in 4 weeks.           To call for increasing pain, problems with incision or heavy bleeding           Condition: Improved

## 2012-12-28 NOTE — Discharge Summary (Signed)
NAMEAQUILA, Rebecca Wiley NO.:  [id number]  MEDICAL RECORD NO.:  [id number]  LOCATION:  9317                          FACILITY:  [facility]  PHYSICIAN:  Miguel Aschoff, M.D.       DATE OF BIRTH:  [birth year]  DATE OF ADMISSION:  12/18/2012 DATE OF DISCHARGE:  12/20/2012                              DISCHARGE SUMMARY   PREOPERATIVE DIAGNOSES:  Menorrhagia and uterine fibroids.  FINAL DIAGNOSES:  Menorrhagia and uterine fibroids.  OPERATIONS AND PROCEDURES:  Supracervical hysterectomy.  BRIEF HISTORY:  The patient is a 47 year old black female with a history of progressively heavier menses that was secondary to large uterine fibroids.  Attempts were made to control her heavy bleeding using Depot- Lupron therapy and also Mirena IUD, but the bleeding persisted in spite of these treatments.  Because of degree of bleeding, the patient has now requested that a definitive procedure be carried out to eliminate the bleeding and looked for the surgical options.  Total abdominal hysterectomy was discussed with the patient as well as supracervical hysterectomy, and because of her desire to maintain her cervix, she opted to undergo supracervical hysterectomy for removal of her uterine fibroids.  HOSPITAL COURSE:  Preoperative labs were obtained.  This included an admission hemoglobin of 11.  PT and PTT were within normal limits. Chemistry profile was essentially unremarkable.  HOSPITAL COURSE:  On December 18, 2012, a supracervical abdominal hysterectomy was carried out without difficulty.  The patient's postoperative course was essentially uncomplicated.  She did tolerate increasing ambulation and diet well.  She was in stable condition and by the 2nd postoperative day, was felt to be in satisfactory enough condition to be discharged home in spite of a drop from her hemoglobin preoperatively 11 down to 8.0 postoperatively.  She remained asymptomatic and non-tachycardic.  She was  discharged home on December 20, 2012, in satisfactory condition.  She was instructed to resume her iron therapy.  She was sent home on Tylox 1 every 3 hours as needed for pain.  She was instructed to do no heavy lifting, place nothing in the vagina and to call if there were any problems such as fever, pain, or heavy bleeding.  She was also instructed to resume all her prior preoperative medications that were not being used to control her bleeding.  The pathology report on the hysterectomy specimen revealed proliferative endometrium with no evidence of hyperplasia.  The myometrium showed leiomyomata.  The serosa was unremarkable.  The fallopian tubes are unremarkable.  The weight of the supracervical specimen was 488 g. The patient was instructed to set up a followup visit in 4 weeks.  To call if there were any problems.  Again, she was sent home on a regular diet in satisfactory condition.     Miguel Aschoff, M.D.     AR/MEDQ  D:  12/27/2012  T:  12/28/2012  Job:  [id number]

## 2013-02-14 ENCOUNTER — Inpatient Hospital Stay (HOSPITAL_COMMUNITY)

## 2013-02-14 ENCOUNTER — Encounter (HOSPITAL_COMMUNITY): Payer: Self-pay

## 2013-02-14 ENCOUNTER — Inpatient Hospital Stay (HOSPITAL_COMMUNITY)
Admission: AD | Admit: 2013-02-14 | Discharge: 2013-02-14 | Disposition: A | Discharge status: Home / Self Care | Source: Ambulatory Visit | Attending: Obstetrics and Gynecology | Admitting: Obstetrics and Gynecology

## 2013-02-14 DIAGNOSIS — G8918 Other acute postprocedural pain: Secondary | ICD-10-CM | POA: Insufficient documentation

## 2013-02-14 DIAGNOSIS — R109 Unspecified abdominal pain: Secondary | ICD-10-CM | POA: Insufficient documentation

## 2013-02-14 DIAGNOSIS — Z90710 Acquired absence of both cervix and uterus: Secondary | ICD-10-CM | POA: Insufficient documentation

## 2013-02-14 HISTORY — DX: Leiomyoma of uterus, unspecified: D25.9

## 2013-02-14 HISTORY — DX: Anemia, unspecified: D64.9

## 2013-02-14 LAB — URINE MICROSCOPIC-ADD ON

## 2013-02-14 LAB — CBC
HCT: 36.7 % (ref 36.0–46.0)
MCV: 87.4 fL (ref 78.0–100.0)
Platelets: 211 10*3/uL (ref 150–400)
RBC: 4.2 MIL/uL (ref 3.87–5.11)
RDW: 12.9 % (ref 11.5–15.5)
WBC: 4.9 10*3/uL (ref 4.0–10.5)

## 2013-02-14 LAB — URINALYSIS, ROUTINE W REFLEX MICROSCOPIC
Nitrite: NEGATIVE — AB
Specific Gravity, Urine: 1.03 (ref 1.005–1.030)
Urobilinogen, UA: 0.2 mg/dL (ref 0.0–1.0)
pH: 6 (ref 5.0–8.0)

## 2013-02-14 MED ORDER — BUTORPHANOL TARTRATE 1 MG/ML IJ SOLN: 2.0000 mg | Freq: Once | INTRAMUSCULAR | Status: DC

## 2013-02-14 MED ORDER — IOHEXOL 300 MG/ML  SOLN
100.0000 mL | Freq: Once | INTRAMUSCULAR | Status: AC | PRN
  Administered 2013-02-14: 100 mL via INTRAVENOUS

## 2013-02-14 MED ORDER — HYDROCODONE-ACETAMINOPHEN 7.5-300 MG PO TABS: 1.0000 | ORAL_TABLET | ORAL | Status: DC

## 2013-02-14 MED ORDER — DICLOFENAC POTASSIUM 50 MG PO TABS: 50.0000 mg | ORAL_TABLET | Freq: Three times a day (TID) | ORAL | Status: DC

## 2013-02-14 MED ORDER — IOHEXOL 300 MG/ML  SOLN
50.0000 mL | INTRAMUSCULAR | Status: AC
  Administered 2013-02-14 (×2): 50 mL via ORAL

## 2013-02-14 MED ORDER — BUTORPHANOL TARTRATE 1 MG/ML IJ SOLN
1.0000 mg | Freq: Once | INTRAMUSCULAR | Status: AC
  Administered 2013-02-14: 1 mg via INTRAVENOUS
  Filled 2013-02-14: qty 1

## 2013-02-14 MED ORDER — LIDOCAINE HCL (PF) 1 % IJ SOLN: 20.0000 mL | Freq: Once | INTRAMUSCULAR | Status: DC

## 2013-02-14 MED ORDER — LIDOCAINE HCL (PF) 1 % IJ SOLN
INTRAMUSCULAR | Status: AC
  Administered 2013-02-14: 20 mL
  Filled 2013-02-14: qty 30

## 2013-02-14 NOTE — MAU Note (Signed)
Had hysterectomy (uterus only) been in pain ever since. Pain is all across the abd, comes and goes, ' a cutting pain'.

## 2013-02-14 NOTE — MAU Provider Note (Signed)
History     CSN: [id number]  Arrival date and time: 02/14/13 1642   None     Chief Complaint  Patient presents with  . Abdominal Pain   HPI   Past Medical History  Diagnosis Date  . Medical history non-contributory   . Anemia   . Uterine fibroid     Past Surgical History  Procedure Laterality Date  . Diagnostic laparoscopy    . Dilation and curettage of uterus    . Abdominal hysterectomy N/A 12/18/2012    Procedure: HYSTERECTOMY ABDOMINAL;  Surgeon: Miguel Aschoff, MD;  Location: [facility];  Service: Gynecology;  Laterality: N/A;  with On Q pump    History reviewed. No pertinent family history.  History  Substance Use Topics  . Smoking status: Never Smoker   . Smokeless tobacco: Never Used  . Alcohol Use: Yes     Comment: socially    Allergies:  Allergies  Allergen Reactions  . Latex Other (See Comments)    Bacterial infection  . Sulfa Antibiotics Swelling and Rash    Eye swelling    Prescriptions prior to admission  Medication Sig Dispense Refill  . ALPRAZolam (XANAX) 0.5 MG tablet Take 0.5 mg by mouth at bedtime as needed. For anxiety/sleep      . ferrous sulfate 325 (65 FE) MG tablet Take 325 mg by mouth daily with breakfast. Alternates with Fusion Plus      . hydrocortisone cream (MONISTAT SOOTHING CARE) 1 % Apply topically 2 (two) times daily.      . Iron-FA-B Cmp-C-Biot-Probiotic (FUSION PLUS PO) Take 1 capsule by mouth daily. Alternates with ferrous sulfate      . meloxicam (MOBIC) 15 MG tablet Take 15 mg by mouth daily.      . metroNIDAZOLE (FLAGYL) 500 MG tablet Take 500 mg by mouth 3 (three) times daily.      . Probiotic Product (PROBIOTIC DAILY PO) Take 1 capsule by mouth daily.      . traMADol (ULTRAM) 50 MG tablet Take 50 mg by mouth every 6 (six) hours as needed for pain.      . [DISCONTINUED] ibuprofen (ADVIL,MOTRIN) 200 MG tablet Take 400-600 mg by mouth every 6 (six) hours as needed. For menstrual pain      . [DISCONTINUED] influenza  inactive  virus vaccine (FLUZONE/FLUARIX) injection Inject 0.5 mLs into the muscle tomorrow at 10 am.  0.25 mL  0  . [DISCONTINUED] Multiple Vitamin (MULTIVITAMIN WITH MINERALS) TABS Take 1 tablet by mouth daily.        ROS Physical Exam   Blood pressure 119/77, pulse 72, temperature 97.8 F (36.6 C), temperature source Oral, resp. rate 18, height 5' 4.5" (1.638 m), weight 71.215 kg (157 lb).  Physical Exam  MAU Course  Procedures     Assessment and Plan  Abdominal wall pain post op without evidence of any acute process requiring surgery.   Sahithi Ordoyne 02/14/2013, 9:11 PM   CT scan shows no acute change in pelvis. Review with radiologist clarifies that 3.8 cm mass is cervix. Patient had supra cervical hysterectomy. No other finds except for mild abdominal wall changes just consistent with recent surgery. There is nothing to suggest anything on CT that would require surgical intervention.   I injected anterior abdominal wall with 20cc's of 1% xylocaine to try to relieve the pain.  Plan is for patient to be discharged home. Will send home on Vicodin 5/300 one every 4 hours as needed for pain and Cataflam 50mg 's TID. Suggest  keeping heat on the abdominal wall. Suggest she take the next week off and check back with me on 02/20/2013 to update.  CBC and U/A today were negative.

## 2013-02-14 NOTE — MAU Provider Note (Signed)
History   47 year old black female who underwent an abdominal hysterectomy on December 18, 2012 for uterine fibroids. She had uncomplicated post operative course but started to develop supra pubic abdominal pain which has increased over the last several days. She denies fever, nausea, vomiting or diarrhea There is no melena. She also denies dysuria frequency or urgency. There has been no blood in the urine. She is now being assessed in triage to see if an etiology of the pain can be established.  CSN: [id number]  Arrival date and time: 02/14/13 1642   None     Chief Complaint  Patient presents with  . Abdominal Pain   HPI   Past Medical History  Diagnosis Date  . Medical history non-contributory   . Anemia   . Uterine fibroid     Past Surgical History  Procedure Laterality Date  . Diagnostic laparoscopy    . Dilation and curettage of uterus    . Abdominal hysterectomy N/A 12/18/2012    Procedure: HYSTERECTOMY ABDOMINAL;  Surgeon: Miguel Aschoff, MD;  Location: [facility];  Service: Gynecology;  Laterality: N/A;  with On Q pump    History reviewed. No pertinent family history.  History  Substance Use Topics  . Smoking status: Never Smoker   . Smokeless tobacco: Never Used  . Alcohol Use: Yes     Comment: socially    Allergies:  Allergies  Allergen Reactions  . Latex Other (See Comments)    Bacterial infection  . Sulfa Antibiotics Swelling and Rash    Eye swelling    Prescriptions prior to admission  Medication Sig Dispense Refill  . ALPRAZolam (XANAX) 0.5 MG tablet Take 0.5 mg by mouth at bedtime as needed. For anxiety/sleep      . ferrous sulfate 325 (65 FE) MG tablet Take 325 mg by mouth daily with breakfast. Alternates with Fusion Plus      . hydrocortisone cream (MONISTAT SOOTHING CARE) 1 % Apply topically 2 (two) times daily.      . Iron-FA-B Cmp-C-Biot-Probiotic (FUSION PLUS PO) Take 1 capsule by mouth daily. Alternates with ferrous sulfate      . meloxicam  (MOBIC) 15 MG tablet Take 15 mg by mouth daily.      . metroNIDAZOLE (FLAGYL) 500 MG tablet Take 500 mg by mouth 3 (three) times daily.      . Probiotic Product (PROBIOTIC DAILY PO) Take 1 capsule by mouth daily.      . traMADol (ULTRAM) 50 MG tablet Take 50 mg by mouth every 6 (six) hours as needed for pain.      . [DISCONTINUED] ibuprofen (ADVIL,MOTRIN) 200 MG tablet Take 400-600 mg by mouth every 6 (six) hours as needed. For menstrual pain      . [DISCONTINUED] influenza  inactive virus vaccine (FLUZONE/FLUARIX) injection Inject 0.5 mLs into the muscle tomorrow at 10 am.  0.25 mL  0  . [DISCONTINUED] Multiple Vitamin (MULTIVITAMIN WITH MINERALS) TABS Take 1 tablet by mouth daily.        ROS Physical Exam   Blood pressure 119/77, pulse 72, temperature 99.1 F (37.3 C), temperature source Oral, resp. rate 18, height 5' 4.5" (1.638 m), weight 71.215 kg (157 lb).  Physical Exam  Abdomen is very tender in the supra pubic region and midline below umbilicus. It is non distended.BS are present. There is voluntary guarding. Wound is well healed. No masses or organomegaly are present.  Pelvic:  External Genitalia : Within normal limits BUS: within normal limits Vagina:  normal discharge no lesions seen Cervix: present but non tender Adnexa: no masses are felt mild tenderness                        Back no CVA tenderness  MAU Course  Procedures  Assessment and Plan  Supra pubic and abdominal pain after supracervical hysterectomy.  Plan: CBC U/A CT scan  Reassess after these studies are completed  Jeanclaude Wentworth 02/14/2013, 8:11 PM

## 2013-02-14 NOTE — MAU Note (Signed)
Pt seen at MD office today. Had pelvic exam/pap smear done. Here for CT of abdomen and pelvis, blood work, and pain medication. Hysterectomy done 12/19/2012.

## 2013-02-14 NOTE — MAU Note (Signed)
Dr Tenny Craw in pt room to discuss CT results. He will give lidocaine injection.

## 2014-05-15 ENCOUNTER — Other Ambulatory Visit: Payer: Self-pay | Admitting: Obstetrics and Gynecology

## 2014-05-16 LAB — CYTOLOGY - PAP

## 2014-09-01 ENCOUNTER — Encounter (HOSPITAL_COMMUNITY): Payer: Self-pay

## 2014-09-03 ENCOUNTER — Other Ambulatory Visit: Payer: Self-pay | Admitting: Obstetrics and Gynecology

## 2014-09-04 LAB — CYTOLOGY - PAP

## 2014-09-11 IMAGING — CT CT ABD-PELV W/ CM
2 of 6 series · 16 of 46 positions shown, 18 images · IV contrast (OMNIPAQUE)
Comparison: 10/08/2012 CT.

***ADDENDUM*** CREATED: 02/14/2013 [DATE]

Per conversation with Dr. Dronar, cervix was left in place and what
is visualized at the vaginal cuff region at [REDACTED] represent the
cervix.  Ultrasound would help if the patient has progressive
symptoms.
Adnexa remain in place greater on the left with possible small
cysts/follicles.
Surrounding the rectus muscle below the level of the umbilicus,
there is mild infiltration of fat planes anterior and posterior to
the rectus muscles which also appear minimally enlarged extending
towards bladder.  This may represent simple postoperative changes,
small amount of blood and / or edema.  No drainable collection
currently. No clinical suspicion of infection.  This can be
reevaluated on follow-up if there are progressive symptoms.
***END ADDENDUM*** SIGNED BY: Marvin Mgcoach Edya, M.D.
CLINICAL DATA: Anemia leading to hysterectomy.  Pain ever since.
CT ABDOMEN AND PELVIS WITH CONTRAST
TECHNIQUE: Multidetector CT imaging of the abdomen and pelvis was
performed following the standard protocol during bolus
administration of intravenous contrast.
Contrast: 1 OMNIPAQUE IOHEXOL 300 MG/ML  SOLN, 100mL OMNIPAQUE
IOHEXOL 300 MG/ML  SOLN

[Series 2: routine abdomen/pelvis with · axial · 0.69mm/px · z∈[-402,-47]mm · 13 of 83 slices shown, 15 images]
[im 6/83  soft-tissue]
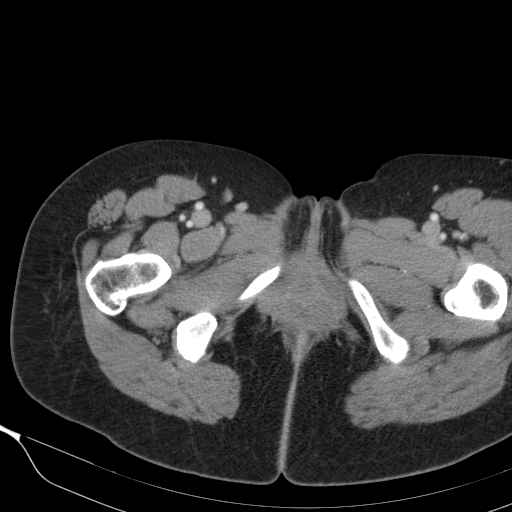
[im 6/83  bone]
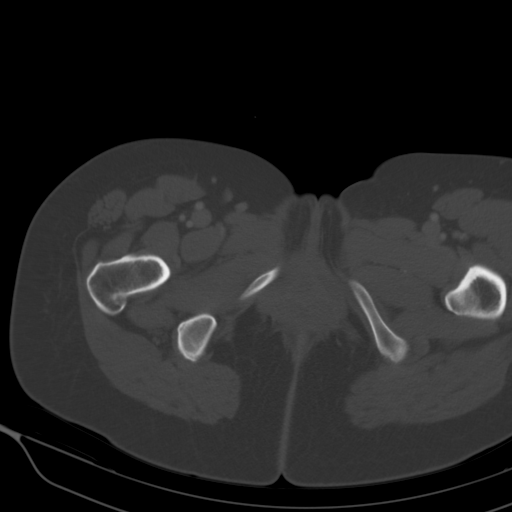
[im 11/83  soft-tissue]
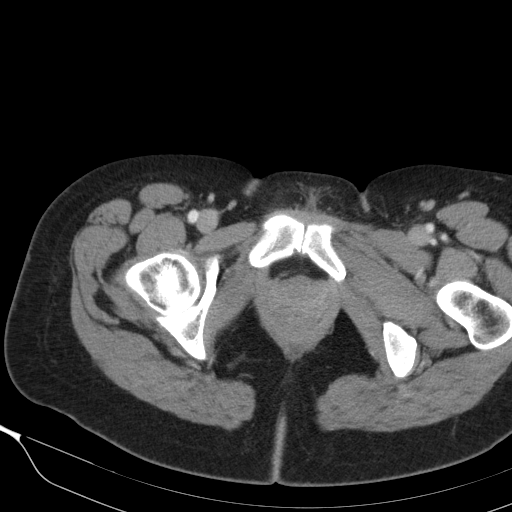
[im 17/83  soft-tissue]
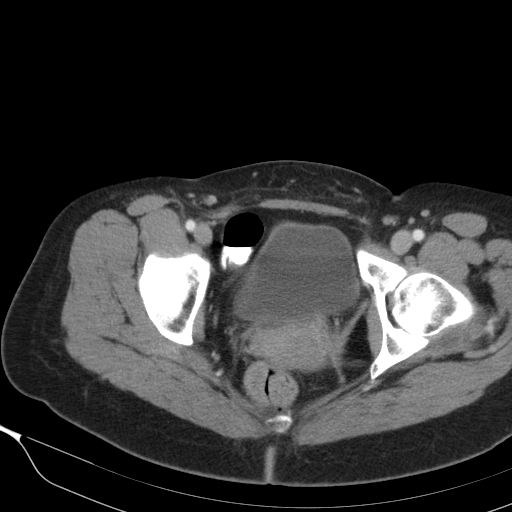
[im 22/83  soft-tissue]
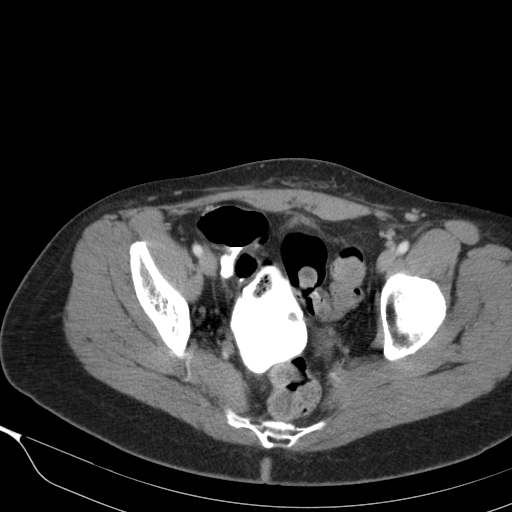
[im 28/83  soft-tissue]
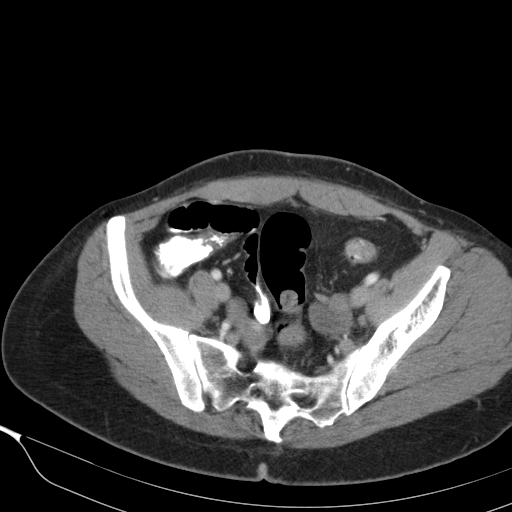
[im 33/83  soft-tissue]
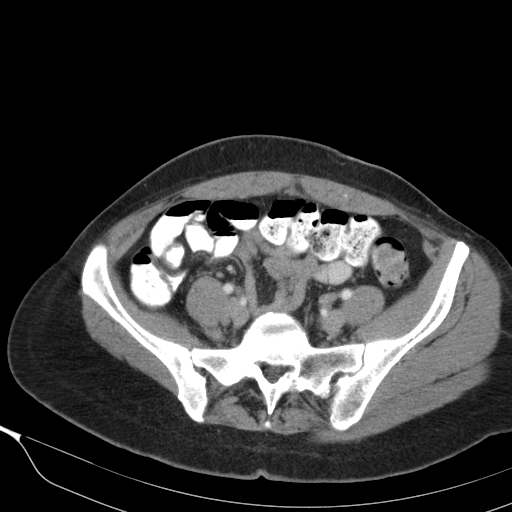
[im 44/83  soft-tissue]
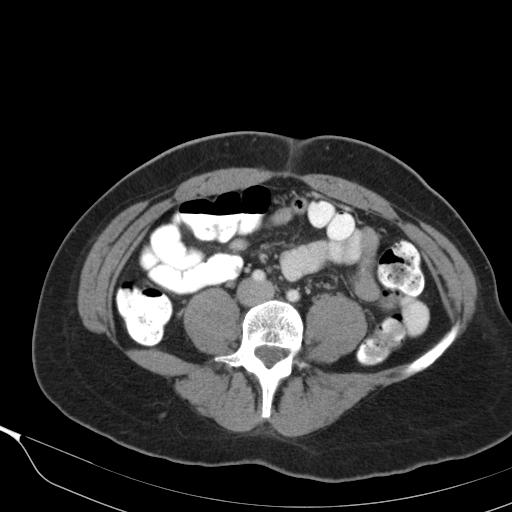
[im 50/83  soft-tissue]
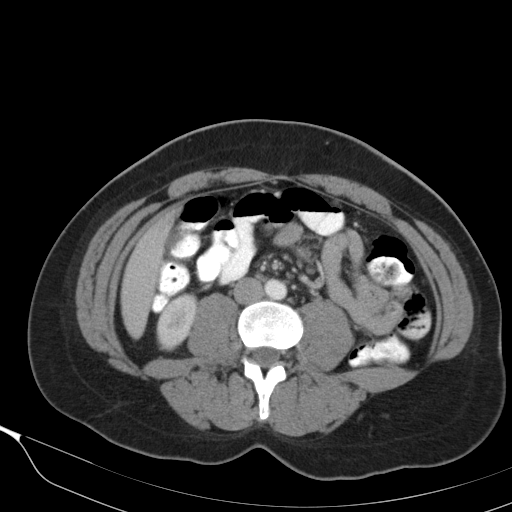
[im 55/83  soft-tissue]
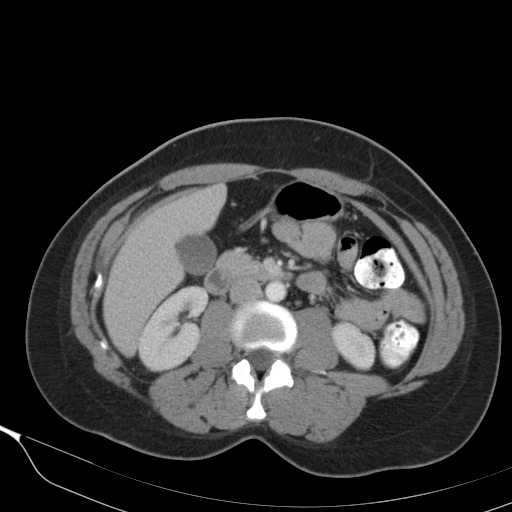
[im 55/83  bone]
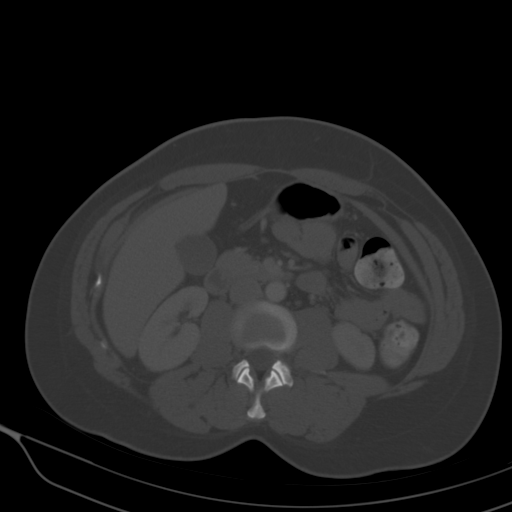
[im 61/83  soft-tissue]
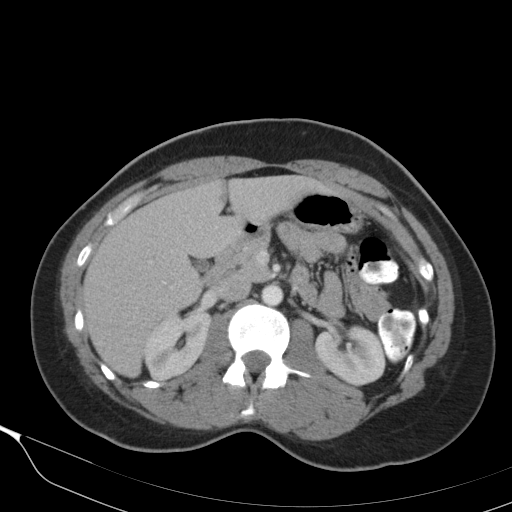
[im 66/83  soft-tissue]
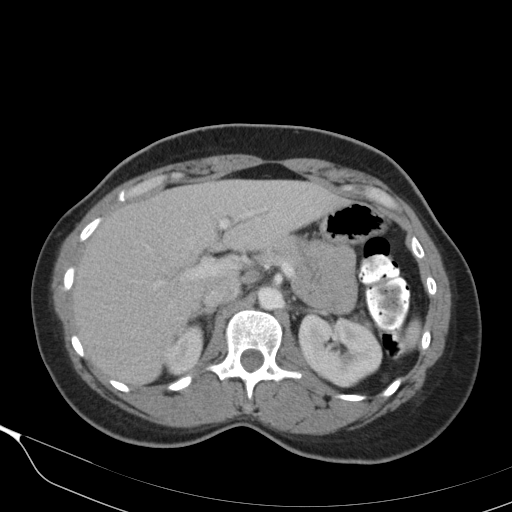
[im 72/83  soft-tissue]
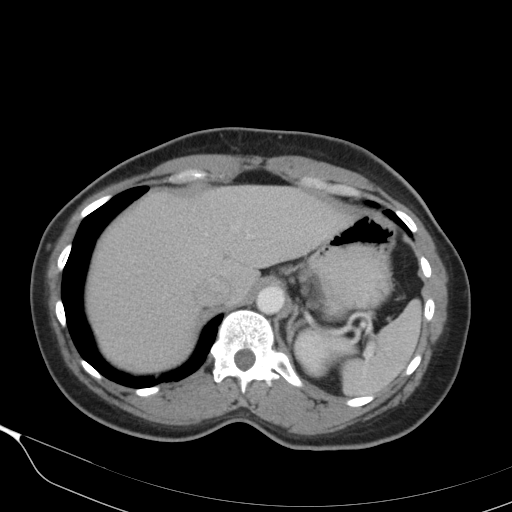
[im 77/83  soft-tissue]
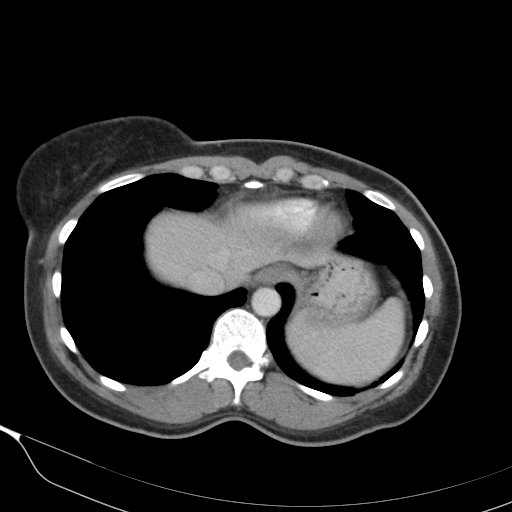

[Series 602: <mpr thick range> · coronal · 0.81mm/px · 3 of 133 slices shown]
[im 45/133  soft-tissue]
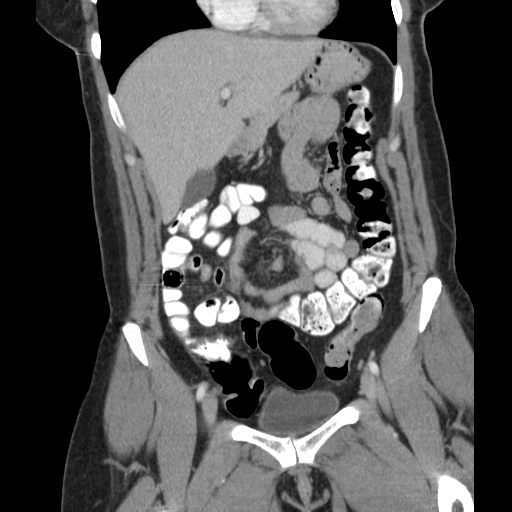
[im 59/133  soft-tissue]
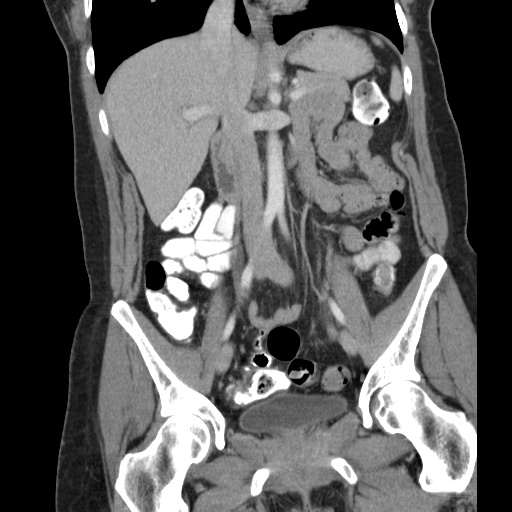
[im 74/133  soft-tissue]
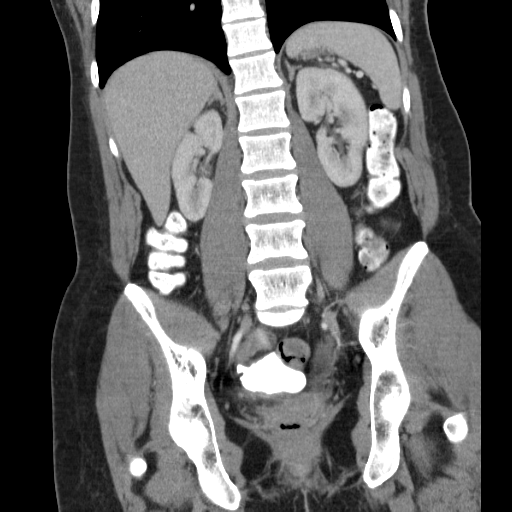

[16 of 46 positions shown; findings below may reference images not displayed]

FINDINGS: Post hysterectomy.  Within the left aspect of the vaginal
cuff there is a 3.8 x 2.9 x 3.6 cm collection which may represent
blood or fluid collection.  This can be evaluated with ultrasound
if clinically desired.

No extraluminal bowel inflammatory process, free fluid or free air.

Top normal size intrahepatic biliary ducts centrally.  No worrisome
hepatic, splenic, renal, adrenal or pancreatic mass.  No calcified
gallstone.

No abdominal aortic aneurysm.  No adenopathy.

Mild hazy appearance of the anterior pelvic subcutaneous tissue may
reflect postsurgical changes.  Urinary bladder unremarkable.

No bony destructive lesion.
IMPRESSION: Post hysterectomy.  Within the left aspect of the vaginal cuff
there is a 3.8 x 2.9 x 3.6 cm collection which may represent blood
or fluid collection.  This can be evaluated with ultrasound if
clinically desired.

Please see above.

## 2020-02-13 ENCOUNTER — Other Ambulatory Visit: Payer: Self-pay

## 2020-02-13 ENCOUNTER — Other Ambulatory Visit (HOSPITAL_COMMUNITY): Payer: Self-pay

## 2020-02-13 ENCOUNTER — Ambulatory Visit (INDEPENDENT_AMBULATORY_CARE_PROVIDER_SITE_OTHER): Payer: No Typology Code available for payment source | Admitting: Psychiatry

## 2020-02-13 ENCOUNTER — Other Ambulatory Visit (HOSPITAL_COMMUNITY): Payer: Self-pay | Admitting: Psychiatry

## 2020-02-13 ENCOUNTER — Encounter (HOSPITAL_COMMUNITY): Payer: Self-pay | Admitting: Psychiatry

## 2020-02-13 DIAGNOSIS — F4312 Post-traumatic stress disorder, chronic: Secondary | ICD-10-CM

## 2020-02-13 DIAGNOSIS — F411 Generalized anxiety disorder: Secondary | ICD-10-CM

## 2020-02-13 DIAGNOSIS — F410 Panic disorder [episodic paroxysmal anxiety] without agoraphobia: Secondary | ICD-10-CM | POA: Insufficient documentation

## 2020-02-13 DIAGNOSIS — F4001 Agoraphobia with panic disorder: Secondary | ICD-10-CM | POA: Diagnosis not present

## 2020-02-13 MED ORDER — PRAZOSIN HCL 1 MG PO CAPS: 1.0000 mg | ORAL_CAPSULE | Freq: Every day | ORAL | 0 refills | Status: DC

## 2020-02-13 MED ORDER — CLONAZEPAM 0.25 MG PO TBDP: 0.2500 mg | ORAL_TABLET | Freq: Two times a day (BID) | ORAL | 2 refills | Status: DC | PRN

## 2020-02-13 MED ORDER — SERTRALINE HCL 100 MG PO TABS: 150.0000 mg | ORAL_TABLET | Freq: Every day | ORAL | 0 refills | Status: DC
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
## 2020-03-12 ENCOUNTER — Ambulatory Visit (HOSPITAL_COMMUNITY): Admitting: Psychiatry

## 2020-04-13 ENCOUNTER — Telehealth (INDEPENDENT_AMBULATORY_CARE_PROVIDER_SITE_OTHER): Payer: No Typology Code available for payment source | Admitting: Psychiatry

## 2020-04-13 ENCOUNTER — Other Ambulatory Visit: Payer: Self-pay

## 2020-04-13 DIAGNOSIS — F410 Panic disorder [episodic paroxysmal anxiety] without agoraphobia: Secondary | ICD-10-CM

## 2020-04-13 DIAGNOSIS — F4312 Post-traumatic stress disorder, chronic: Secondary | ICD-10-CM

## 2020-04-13 DIAGNOSIS — F411 Generalized anxiety disorder: Secondary | ICD-10-CM

## 2020-04-13 MED ORDER — SERTRALINE HCL 100 MG PO TABS: 150.0000 mg | ORAL_TABLET | Freq: Every day | ORAL | 0 refills | Status: DC

## 2020-04-13 MED ORDER — PRAZOSIN HCL 1 MG PO CAPS: 1.0000 mg | ORAL_CAPSULE | Freq: Every day | ORAL | 0 refills | Status: DC
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
## 2020-04-17 ENCOUNTER — Other Ambulatory Visit (HOSPITAL_COMMUNITY): Payer: Self-pay | Admitting: Psychiatry

## 2020-04-17 MED ORDER — CLONAZEPAM 0.25 MG PO TBDP: 0.2500 mg | ORAL_TABLET | Freq: Two times a day (BID) | ORAL | 2 refills | Status: DC | PRN

## 2020-04-27 ENCOUNTER — Telehealth (HOSPITAL_COMMUNITY): Payer: Self-pay

## 2020-04-27 NOTE — Telephone Encounter (Signed)
Called patient but she didn't answer - LVM to call me back to let me know whether she wants to take regular Klonopin0.25mg  or switch to Valium 2mg 

## 2020-04-27 NOTE — Telephone Encounter (Signed)
She has been taking 0.25 mg of regular clonazepam which helps but makes her groggy. Perhaps we could try diazepam 2 mg instead? Could you call and ask her if she prefers to stay on regular Klonopin 0.25 mg of switch to Valium 2 mg? I will print and sign a new Rx this weekend and fax it to [place].

## 2020-04-27 NOTE — Telephone Encounter (Signed)
Received fax from [facility] regarding patient's Clonazepam 0.25mg . They stated that the Clonazepam disintegrating tablet is non-formulary and insurance won't cover it. They stated they need a new script for regular Clonazepam 0.5mg , 1mg , or 2mg . Please review and advise. Thank you.

## 2020-05-03 ENCOUNTER — Other Ambulatory Visit (HOSPITAL_COMMUNITY): Payer: Self-pay | Admitting: Psychiatry

## 2020-05-03 MED ORDER — CLONAZEPAM 0.5 MG PO TABS: 0.2500 mg | ORAL_TABLET | Freq: Two times a day (BID) | ORAL | 2 refills | Status: DC | PRN

## 2020-05-13 ENCOUNTER — Telehealth (HOSPITAL_COMMUNITY): Payer: Self-pay

## 2020-05-13 NOTE — Telephone Encounter (Signed)
Spoke with the [facility] and they confirmed that this patient has been taken care of and is receiving her Clonazepam 0.5mg . Her doctor faxed a script over on 05/03/20.

## 2020-05-20 ENCOUNTER — Telehealth (INDEPENDENT_AMBULATORY_CARE_PROVIDER_SITE_OTHER): Payer: No Typology Code available for payment source | Admitting: Psychiatry

## 2020-05-20 ENCOUNTER — Other Ambulatory Visit: Payer: Self-pay

## 2020-05-20 DIAGNOSIS — F410 Panic disorder [episodic paroxysmal anxiety] without agoraphobia: Secondary | ICD-10-CM | POA: Diagnosis not present

## 2020-05-20 DIAGNOSIS — F4312 Post-traumatic stress disorder, chronic: Secondary | ICD-10-CM

## 2020-05-20 DIAGNOSIS — F411 Generalized anxiety disorder: Secondary | ICD-10-CM

## 2020-05-20 MED ORDER — BUPROPION HCL ER (XL) 150 MG PO TB24: 150.0000 mg | ORAL_TABLET | ORAL | 0 refills | Status: DC
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
## 2020-08-27 ENCOUNTER — Other Ambulatory Visit (HOSPITAL_COMMUNITY): Payer: Self-pay | Admitting: Psychiatry

## 2020-08-27 MED ORDER — SERTRALINE HCL 100 MG PO TABS: 150.0000 mg | ORAL_TABLET | Freq: Every day | ORAL | 0 refills | Status: DC

## 2020-08-27 MED ORDER — PRAZOSIN HCL 1 MG PO CAPS: 1.0000 mg | ORAL_CAPSULE | Freq: Every day | ORAL | 0 refills | Status: DC

## 2020-08-27 MED ORDER — BUPROPION HCL ER (XL) 150 MG PO TB24: 150.0000 mg | ORAL_TABLET | ORAL | 0 refills | Status: DC

## 2020-08-28 ENCOUNTER — Telehealth (INDEPENDENT_AMBULATORY_CARE_PROVIDER_SITE_OTHER): Payer: No Typology Code available for payment source | Admitting: Psychiatry

## 2020-08-28 ENCOUNTER — Other Ambulatory Visit: Payer: Self-pay

## 2020-08-28 DIAGNOSIS — F411 Generalized anxiety disorder: Secondary | ICD-10-CM

## 2020-08-28 DIAGNOSIS — F410 Panic disorder [episodic paroxysmal anxiety] without agoraphobia: Secondary | ICD-10-CM | POA: Diagnosis not present

## 2020-08-28 DIAGNOSIS — F4312 Post-traumatic stress disorder, chronic: Secondary | ICD-10-CM
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
## 2020-09-06 ENCOUNTER — Other Ambulatory Visit (HOSPITAL_COMMUNITY): Payer: Self-pay | Admitting: Psychiatry

## 2020-09-06 MED ORDER — CLONAZEPAM 0.5 MG PO TABS
0.2500 mg | ORAL_TABLET | Freq: Two times a day (BID) | ORAL | 2 refills | Status: DC | PRN
Start: 2020-09-06 — End: 2020-12-18

## 2020-11-20 ENCOUNTER — Other Ambulatory Visit: Payer: Self-pay

## 2020-11-20 ENCOUNTER — Telehealth (HOSPITAL_COMMUNITY): Payer: No Typology Code available for payment source | Admitting: Psychiatry

## 2020-12-15 ENCOUNTER — Telehealth (INDEPENDENT_AMBULATORY_CARE_PROVIDER_SITE_OTHER): Payer: No Typology Code available for payment source | Admitting: Psychiatry

## 2020-12-15 ENCOUNTER — Other Ambulatory Visit (HOSPITAL_COMMUNITY): Payer: Self-pay | Admitting: Psychiatry

## 2020-12-15 ENCOUNTER — Other Ambulatory Visit: Payer: Self-pay

## 2020-12-15 DIAGNOSIS — F410 Panic disorder [episodic paroxysmal anxiety] without agoraphobia: Secondary | ICD-10-CM | POA: Diagnosis not present

## 2020-12-15 DIAGNOSIS — F4312 Post-traumatic stress disorder, chronic: Secondary | ICD-10-CM | POA: Diagnosis not present

## 2020-12-15 DIAGNOSIS — F411 Generalized anxiety disorder: Secondary | ICD-10-CM | POA: Diagnosis not present

## 2020-12-15 MED ORDER — SERTRALINE HCL 100 MG PO TABS
150.0000 mg | ORAL_TABLET | Freq: Every day | ORAL | 0 refills | Status: AC
Start: 2020-12-15 — End: 2021-03-15

## 2020-12-15 MED ORDER — PRAZOSIN HCL 1 MG PO CAPS: 1.0000 mg | ORAL_CAPSULE | Freq: Every day | ORAL | 0 refills | Status: AC

## 2020-12-15 MED ORDER — BUPROPION HCL ER (XL) 150 MG PO TB24: 150.0000 mg | ORAL_TABLET | ORAL | 0 refills | Status: AC
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
## 2020-12-18 ENCOUNTER — Other Ambulatory Visit (HOSPITAL_COMMUNITY): Payer: Self-pay | Admitting: Psychiatry

## 2020-12-18 MED ORDER — CLONAZEPAM 0.5 MG PO TABS: 0.2500 mg | ORAL_TABLET | Freq: Two times a day (BID) | ORAL | 2 refills | Status: AC | PRN

## 2021-08-06 ENCOUNTER — Encounter: Payer: Self-pay | Admitting: Gastroenterology

## 2022-09-05 ENCOUNTER — Other Ambulatory Visit: Payer: Self-pay | Admitting: Family Medicine

## 2022-09-05 DIAGNOSIS — Z1231 Encounter for screening mammogram for malignant neoplasm of breast: Secondary | ICD-10-CM
